# Patient Record
Sex: Female | Born: 1995 | Race: White | Hispanic: No | Marital: Single | Smoking: Current every day smoker
Health system: Southern US, Community
[De-identification: ages and names within clinical notes are randomized; demographics above are authoritative.]

## PROBLEM LIST (undated history)

## (undated) ENCOUNTER — Inpatient Hospital Stay (HOSPITAL_COMMUNITY): Payer: BLUE CROSS/BLUE SHIELD

## (undated) DIAGNOSIS — R51 Headache: Secondary | ICD-10-CM

## (undated) DIAGNOSIS — H4710 Unspecified papilledema: Secondary | ICD-10-CM

## (undated) DIAGNOSIS — R519 Headache, unspecified: Secondary | ICD-10-CM

## (undated) HISTORY — DX: Unspecified papilledema: H47.10

## (undated) HISTORY — DX: Headache, unspecified: R51.9

## (undated) HISTORY — PX: OTHER SURGICAL HISTORY: SHX169

## (undated) HISTORY — DX: Headache: R51

---

## 2015-08-28 ENCOUNTER — Encounter: Payer: Self-pay | Admitting: Neurology

## 2015-08-28 ENCOUNTER — Ambulatory Visit (INDEPENDENT_AMBULATORY_CARE_PROVIDER_SITE_OTHER): Payer: BLUE CROSS/BLUE SHIELD | Admitting: Neurology

## 2015-08-28 VITALS — BP 131/89 | HR 82 | Ht 70.0 in | Wt 247.0 lb

## 2015-08-28 DIAGNOSIS — G43909 Migraine, unspecified, not intractable, without status migrainosus: Secondary | ICD-10-CM | POA: Insufficient documentation

## 2015-08-28 DIAGNOSIS — G932 Benign intracranial hypertension: Secondary | ICD-10-CM | POA: Diagnosis not present

## 2015-08-28 DIAGNOSIS — G43009 Migraine without aura, not intractable, without status migrainosus: Secondary | ICD-10-CM

## 2015-08-28 MED ORDER — TOPIRAMATE 50 MG PO TABS: ORAL_TABLET | ORAL | Status: DC

## 2015-08-28 NOTE — Progress Notes (Signed)
PATIENT: Courtney Osborne DOB: Sep 21, 1995  Chief Complaint  Patient presents with  . Papilledema    She is here with her sister, Aundra Millet.  She had been having frequent headaches for several months that prompted a medical evaluation by her eye doctor.  Her exam revealed optic nerve swelling.  He sent her for a MRI scan that was abnormal.  Her headaches have improved and she denies having vision difficulty since getting new glasses.     HISTORICAL  Courtney Osborne is a 20 years old right-handed female, accompanied by her sister Aundra Millet seen in refer by her ophthalmologist  Izora Ribas in August 28 2015 for evaluation of headaches, optic nerve swelling  I have reviewed and summarized most recent office note in December twelfth 2016, there was mild bilateral margin blurry without vascular obscurations  She reported history of migraine headaches since high school, her typical migraine are bilateral retro-orbital area severe pounding headache with associated light noise sensitivity, nauseous, headache can last for a few hours, usually relieved by Excedrin Migraine, but sometimes she has multiple recurrent headaches in short period of time,  In December 2016, she works long hours, very stressful, she began to have frequent headaches, also noticed blurry vision, difficulty focusing,  She was seen by optometrist, 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 Cephalosporins Hives    HOME MEDICATIONS: Current Outpatient Prescriptions  Medication Sig Dispense Refill  . norgestimate-ethinyl estradiol (ORTHO-CYCLEN,SPRINTEC,PREVIFEM)  0.25-35 MG-MCG tablet Take 1 tablet by mouth daily.       PAST MEDICAL HISTORY: Past Medical History  Diagnosis Date  . Optic nerve edema   . Headache     PAST SURGICAL HISTORY: Past Surgical History  Procedure Laterality Date  . No past surgeries      FAMILY HISTORY: Family History  Problem Relation Age of Onset  . Hypertension Father   . Hypercholesterolemia Father   . Healthy Mother   . Diabetes Sister   . Hypertension Sister     SOCIAL HISTORY:  Social History   Social History  . Marital Status: Single    Spouse Name: N/A  . Number of Children: 0  . 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 Smoking status: Current Every Day Smoker -- 0.50 packs/day    Types: Cigarettes  . Smokeless tobacco: Not on file  . Alcohol Use: No  . Drug Use: No  . Sexual Activity: Not on file   Other Topics Concern  . Not on file   Social History Narrative   Lives at home with parents.   Right-handed.   1 cup caffeine daily.     PHYSICAL EXAM   Filed Vitals:   08/28/15 1031  BP: 131/89  Pulse: 82  Height: 5\' 10"  (1.778 m)  Weight: 247 lb (112.038 kg)    Not recorded      Body mass index is 35.44 kg/(m^2).  PHYSICAL EXAMNIATION:  Gen: NAD, conversant, well nourised, obese, well groomed  Cardiovascular: Regular rate rhythm, no peripheral edema, warm, nontender. Eyes: Conjunctivae clear without exudates or hemorrhage Neck: Supple, no carotid bruise. Pulmonary: Clear to auscultation bilaterally   NEUROLOGICAL EXAM:  MENTAL STATUS: Speech:    Speech is normal; fluent and spontaneous with normal comprehension.  Cognition:     Orientation to time, place and person     Normal recent and remote memory     Normal Attention span and concentration     Normal Language, naming, repeating,spontaneous speech     Fund of knowledge   CRANIAL NERVES: CN II: Visual  fields are full to confrontation. Fundoscopic exam showed Larry attached bilaterally, there was no vascular changes. Pupils are round equal and briskly reactive to light. CN III, IV, VI: extraocular movement are normal. No ptosis. CN V: Facial sensation is intact to pinprick in all 3 divisions bilaterally. Corneal responses are intact.  CN VII: Face is symmetric with normal eye closure and smile. CN VIII: Hearing is normal to rubbing fingers CN IX, X: Palate elevates symmetrically. Phonation is normal. CN XI: Head turning and shoulder shrug are intact CN XII: Tongue is midline with normal movements and no atrophy.  MOTOR: There is no pronator drift of out-stretched arms. Muscle bulk and tone are normal. Muscle strength is normal.  REFLEXES: Reflexes are 2+ and symmetric at the biceps, triceps, knees, and ankles. Plantar responses are flexor.  SENSORY: Intact to light touch, pinprick, position sense, and vibration sense are intact in fingers and toes.  COORDINATION: Rapid alternating movements and fine finger movements are intact. There is no dysmetria on finger-to-nose and heel-knee-shin.    GAIT/STANCE: Posture is normal. Gait is steady with normal steps, base, arm swing, and turning. Heel and toe walking are normal. Tandem gait is normal.  Romberg is absent.   DIAGNOSTIC DATA (LABS, IMAGING, TESTING) - I reviewed patient records, labs, notes, testing and imaging myself where available.   ASSESSMENT AND PLAN  Courtney Osborne is a 20 y.o. female   Pseudotumor cerebri  Fluoroscopy guided lumbar puncture  Add on Topamax titrating to 100 mg twice a day Chronic Migraine Headaches  Excedrin Migraine as needed   Levert FeinsteinYijun Argyle Gustafson, M.D. Ph.D.  [facility] [address], [address] [place], [place] [address] Ph: [phone](336) [phone] Fax: [phone]  ON:[id number]:Nathaniel Cleaveland, DO

## 2015-09-09 ENCOUNTER — Telehealth: Payer: Self-pay | Admitting: Neurology

## 2015-09-09 NOTE — Telephone Encounter (Signed)
Courtney Osborne with [facility] is calling and states the patient would like to have [place] lumbar puncture in [place] closer to where she lives so she did not schedule her at [facility].  Thanks!

## 2015-09-10 NOTE — Telephone Encounter (Signed)
[facility] and they relayed to me to fax orders to [facility] at [phone] and she will call patient in one weeks time to schedule.

## 2015-09-11 ENCOUNTER — Telehealth: Payer: Self-pay | Admitting: Neurology

## 2015-09-11 NOTE — Telephone Encounter (Signed)
Patient has now decided to have her LP in [place].  I have spoken to [place] at [facility] and she will contact the patient to schedule.

## 2015-09-11 NOTE — Telephone Encounter (Signed)
Spoke with Lurena Joiner - she is unable to schedule patient for at least two weeks.  I called the patient and she would now like to have the LP in [place].  Rayfield Citizen at [facility] is aware and will contact the patient for scheduling.  Lurena Joiner at [facility] is aware.

## 2015-09-11 NOTE — Telephone Encounter (Signed)
[facility] Rn with [facility] [phone] calling reg orders for LP. She needs clarification on lots of questions before she can schedule the pt

## 2015-09-11 NOTE — Telephone Encounter (Signed)
She is scheduled on 09/18/15.

## 2015-09-11 NOTE — Telephone Encounter (Signed)
[facility] [phone] called regarding lumbar puncture, needs to speak with Marcelino Duster regarding this.

## 2015-09-18 ENCOUNTER — Other Ambulatory Visit: Payer: Self-pay | Admitting: Neurology

## 2015-09-18 ENCOUNTER — Other Ambulatory Visit: Payer: Self-pay | Admitting: *Deleted

## 2015-09-18 ENCOUNTER — Ambulatory Visit
Admission: RE | Admit: 2015-09-18 | Discharge: 2015-09-18 | Disposition: A | Discharge status: Home / Self Care | Payer: BLUE CROSS/BLUE SHIELD | Source: Ambulatory Visit | Attending: Neurology | Admitting: Neurology

## 2015-09-18 ENCOUNTER — Telehealth: Payer: Self-pay | Admitting: Neurology

## 2015-09-18 DIAGNOSIS — G932 Benign intracranial hypertension: Secondary | ICD-10-CM

## 2015-09-18 DIAGNOSIS — G43009 Migraine without aura, not intractable, without status migrainosus: Secondary | ICD-10-CM

## 2015-09-18 LAB — CSF CELL COUNT WITH DIFFERENTIAL
Eosinophils, CSF: 0 % (ref 0–1)
LYMPHS CSF: 76 % (ref 40–80)
MONOCYTE/MACROPHAGE: 21 % (ref 15–45)
RBC COUNT CSF: 0 uL
SEGMENTED NEUTROPHILS-CSF: 3 % (ref 0–6)
Tube #: 3
WBC CSF: 1 uL (ref 0–5)

## 2015-09-18 LAB — GRAM STAIN: Gram Stain: NONE SEEN

## 2015-09-18 LAB — PROTEIN, CSF: Total Protein, CSF: 49 mg/dL — ABNORMAL HIGH (ref 15–45)

## 2015-09-18 LAB — GLUCOSE, CSF: Glucose, CSF: 68 mg/dL (ref 43–76)

## 2015-09-18 MED ORDER — TOPIRAMATE 100 MG PO TABS: ORAL_TABLET | ORAL | Status: DC

## 2015-09-18 NOTE — Discharge Instructions (Signed)

## 2015-09-18 NOTE — Telephone Encounter (Signed)
Patient aware of results.  Says she is doing well on  BID.  She would like a new [facility] [place], [place].

## 2015-09-18 NOTE — Telephone Encounter (Signed)
I have called, failed to reach her,   Please call her again, at spinal tap, open pressure was mildly elevated 22 cm water, normal being less than 20 cm water, spinal fluid testing was normal,  She should continue taking Topamax as previously prescribed

## 2015-09-19 LAB — VDRL, CSF: SYPHILIS VDRL QUANT CSF: NONREACTIVE

## 2015-09-30 ENCOUNTER — Other Ambulatory Visit: Payer: Self-pay | Admitting: *Deleted

## 2015-09-30 ENCOUNTER — Telehealth: Payer: Self-pay | Admitting: Neurology

## 2015-09-30 MED ORDER — TOPIRAMATE 100 MG PO TABS: ORAL_TABLET | ORAL | Status: DC

## 2015-09-30 NOTE — Telephone Encounter (Signed)
Rx sent to correct pharmacy.

## 2015-09-30 NOTE — Telephone Encounter (Signed)
Patient is calling and states that her Rx Topiramate 100 mg should have been sent to [facility] [place], [place] and is requesting a transfer from [place] where it was sent.  Thanks!

## 2015-10-01 NOTE — Telephone Encounter (Signed)
Pt's mother called and states rx topiramate (TOPAMAX) 100 MG tablet will cost $144 for her daughter. She can not afford that and is wondering if anything can be done. New medication, generic, new pharmacy? Please call and advise [phone] Bene, Mother

## 2015-10-01 NOTE — Telephone Encounter (Signed)
Called the pharmacy - goodrx coupon code provided - cost of med dropped to $13.45 - pt and mother aware of new cost and will pick up the prescription.

## 2015-10-14 LAB — FUNGUS CULTURE W SMEAR: SMEAR RESULT: NONE SEEN

## 2015-10-16 ENCOUNTER — Ambulatory Visit (INDEPENDENT_AMBULATORY_CARE_PROVIDER_SITE_OTHER): Payer: BLUE CROSS/BLUE SHIELD | Admitting: Neurology

## 2015-10-16 ENCOUNTER — Encounter: Payer: Self-pay | Admitting: Neurology

## 2015-10-16 VITALS — BP 126/84 | HR 81 | Ht 70.0 in | Wt 245.0 lb

## 2015-10-16 DIAGNOSIS — G43009 Migraine without aura, not intractable, without status migrainosus: Secondary | ICD-10-CM

## 2015-10-16 DIAGNOSIS — G932 Benign intracranial hypertension: Secondary | ICD-10-CM

## 2015-10-16 MED ORDER — TOPIRAMATE 100 MG PO TABS: ORAL_TABLET | ORAL | Status: AC

## 2015-10-16 NOTE — Progress Notes (Signed)
Chief Complaint  Patient presents with  . Pseudotumor Cerebri    She is here with her sister, Aundra Millet.  She is tolerating the topiramate , BID well.  States her headaches have improved.      PATIENT: Courtney Osborne DOB: 08/19/1996  Chief Complaint  Patient presents with  . Pseudotumor Cerebri    She is here with her sister, Aundra Millet.  She is tolerating the topiramate , BID well.  States her headaches have improved.  She has a pending LP appointment on 10/19/15.     HISTORICAL  Courtney Osborne is a 20 years old right-handed female, accompanied by her sister Aundra Millet seen in refer by her ophthalmologist  Izora Ribas in August 28 2015 for evaluation of headaches, optic nerve swelling  I have reviewed and summarized most recent office note in December twelfth 2016, there was mild bilateral margin blurry without vascular obscurations  She reported history of migraine headaches since high school, her typical migraine are bilateral retro-orbital area severe pounding headache with associated light noise sensitivity, nauseous, headache can last for a few hours, usually relieved by Excedrin Migraine, but sometimes she has multiple recurrent headaches in short period of time,  In December 2016, she works long hours, very stressful, she began to have frequent headaches, also noticed blurry vision, difficulty focusing,  She was seen by optometrist, 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  She did experience some change of the case, She tends to drink 102 soft drinks each day,  She has no blurry vision.   REVIEW OF SYSTEMS: Full 14 system review of systems performed and notable only for blurred vision, headaches  ALLERGIES: Allergies  Allergen Reactions  . Cephalosporins Hives    HOME MEDICATIONS: Current Outpatient Prescriptions  Medication Sig Dispense Refill  . norgestimate-ethinyl estradiol (ORTHO-CYCLEN,SPRINTEC,PREVIFEM) 0.25-35 MG-MCG tablet Take 1 tablet by mouth daily.       PAST MEDICAL HISTORY: Past Medical History  Diagnosis Date  . Optic nerve edema   . Headache     PAST SURGICAL HISTORY: Past Surgical History  Procedure Laterality Date  . No past surgeries      FAMILY HISTORY: Family History  Problem Relation Age of Onset  . Hypertension Father   . Hypercholesterolemia Father   . Healthy Mother   . Diabetes Sister   . Hypertension Sister     SOCIAL HISTORY:  Social History   Social History  . Marital Status: Single    Spouse Name: N/A  . Number of Children: 0  . 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 Smoking status: Current Every Day Smoker -- 0.50 packs/day    Types: Cigarettes  . Smokeless tobacco: Not on file  . Alcohol Use: No  . Drug Use: No  . Sexual Activity: Not on file   Other Topics Concern  . Not on file  Social History Narrative   Lives at home with parents.   Right-handed.   1 cup caffeine daily.     PHYSICAL EXAM   Filed Vitals:   10/16/15 0929  BP: 126/84  Pulse: 81  Height:  (1.778 m)  Weight: 245 lb (111.131 kg)    Not recorded      Body mass index is 35.15 kg/(m^2).  PHYSICAL EXAMNIATION:  Gen: NAD, conversant, well nourised, obese, well groomed                     Cardiovascular: Regular rate rhythm, no peripheral edema, warm, nontender. Eyes: Conjunctivae  clear without exudates or hemorrhage Neck: Supple, no carotid bruise. Pulmonary: Clear to auscultation bilaterally   NEUROLOGICAL EXAM:  MENTAL STATUS: Speech:    Speech is normal; fluent and spontaneous with normal comprehension.  Cognition:     Orientation to time, place and person     Normal recent and remote memory     Normal Attention span and concentration     Normal Language, naming, repeating,spontaneous speech     Fund of knowledge   CRANIAL NERVES: CN II: Visual fields are full to confrontation. Fundoscopic exam showed mild blurred edge bilaterally, there was no vascular changes. Pupils are round equal and briskly reactive to light. CN III, IV, VI: extraocular movement are normal. No ptosis. CN V: Facial sensation is intact to pinprick in all 3 divisions bilaterally. Corneal responses are intact.  CN VII: Face is symmetric with normal eye closure and smile. CN VIII: Hearing is normal to rubbing fingers CN IX, X: Palate elevates symmetrically. Phonation is normal. CN XI: Head turning and shoulder shrug are intact CN XII: Tongue is midline with normal movements and no atrophy.  MOTOR: There is no pronator drift of out-stretched arms. Muscle bulk and tone are normal. Muscle strength is normal.  REFLEXES: Reflexes are 2+ and symmetric at the biceps, triceps, knees, and ankles. Plantar responses are flexor.  SENSORY: Intact to light touch, pinprick, position sense, and vibration sense are intact in fingers and toes.  COORDINATION: Rapid alternating movements and fine finger movements are intact. There is no dysmetria on finger-to-nose and heel-knee-shin.    GAIT/STANCE: Posture is normal. Gait is steady with normal steps, base, arm swing, and turning. Heel and toe walking are normal. Tandem gait is normal.  Romberg is absent.   DIAGNOSTIC DATA (LABS, IMAGING, TESTING) - I reviewed patient records, labs, notes, testing and imaging myself where  available.   ASSESSMENT AND PLAN  Courtney Osborne is a 20 y.o. female   Pseudotumor cerebri  Fluoroscopy guided lumbar puncture in September 18 2015, open pressure 22 cm water, CSF showed mild elevated total protein 49 otherwise normal  Keep Topamax 100 mg twice a day  Chronic Migraine Headaches  Excedrin Migraine as needed   Levert Feinstein, M.D. Ph.D.  [facility] [address] [place], [place] [address] Ph: [phone] Fax: [phone]  QI:[id number] Cleaveland, DO

## 2015-12-17 ENCOUNTER — Ambulatory Visit (INDEPENDENT_AMBULATORY_CARE_PROVIDER_SITE_OTHER): Payer: BLUE CROSS/BLUE SHIELD | Admitting: Nurse Practitioner

## 2015-12-17 ENCOUNTER — Encounter: Payer: Self-pay | Admitting: Nurse Practitioner

## 2015-12-17 VITALS — BP 128/78 | HR 75 | Ht 70.0 in | Wt 246.8 lb

## 2015-12-17 DIAGNOSIS — G43009 Migraine without aura, not intractable, without status migrainosus: Secondary | ICD-10-CM | POA: Diagnosis not present

## 2015-12-17 DIAGNOSIS — G932 Benign intracranial hypertension: Secondary | ICD-10-CM

## 2015-12-17 NOTE — Progress Notes (Signed)
[facility]  PATIENT: Courtney Osborne DOB: 03/23/1996   REASON FOR VISIT: Follow-up for migraines and pseudotumor cerebri HISTORY FROM: Patient    HISTORY OF PRESENT ILLNESS: HISTORY Courtney Osborne is a 20 years old right-handed female, accompanied by her sister Courtney Osborne seen in refer by her ophthalmologist Courtney Osborne in August 28 2015 for evaluation of headaches, optic nerve swelling I have reviewed and summarized most recent office note in December twelfth 2016, there was mild bilateral margin blurry without vascular obscurations She reported history of migraine headaches since high school, her typical migraine are bilateral retro-orbital area severe pounding headache with associated light noise sensitivity, nauseous, headache can last for a few hours, usually relieved by Excedrin Migraine, but sometimes she has multiple recurrent headaches in short period of time, In December 2016, she works long hours, very stressful, she began to have frequent headaches, also noticed blurry vision, difficulty focusing, She was seen by optometrist, 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 She did experience some change of the case, She tends to drink  soft drinks each day,She has no blurry vision.  UPDATE 04/25/2017CM Courtney Osborne, 20 year old female returns for follow-up. She has a history of migraines and pseudotumor cerebri. Her headaches are  under good control with Topamax 100 twice daily. She did not have her repeat LP in February after her last visit with Dr.Yan. She denies any visual symptoms today she is not aware of any foods that cause problems. She returns for reevaluation   REVIEW OF SYSTEMS: Full 14 system review of systems performed and notable only for those listed, all others are neg:  Constitutional: neg  Cardiovascular: neg Ear/Nose/Throat: neg  Skin: neg Eyes: neg Respiratory: neg Gastroitestinal: neg  Hematology/Lymphatic: neg  Endocrine: neg Musculoskeletal:neg Allergy/Immunology: neg Neurological: neg Psychiatric: neg Sleep : neg   ALLERGIES: Allergies  Allergen Reactions  . Cephalosporins Hives    HOME MEDICATIONS: Outpatient Prescriptions Prior to Visit  Medication Sig Dispense Refill  . norgestimate-ethinyl estradiol (ORTHO-CYCLEN,SPRINTEC,PREVIFEM) 0.25-35 MG-MCG tablet Take 1 tablet by mouth daily.    Marland Kitchen topiramate (TOPAMAX) 100 MG tablet Take one tablet twice daily. 60 tablet 11   No facility-administered medications prior to visit.    PAST MEDICAL HISTORY: Past Medical History  Diagnosis Date  . Optic nerve edema   . Headache     PAST SURGICAL HISTORY: Past Surgical History  Procedure Laterality Date  . No past surgeries      FAMILY HISTORY: Family History  Problem Relation Age of Onset  . Hypertension Father   . Hypercholesterolemia Father   . Healthy Mother   . Diabetes Sister   . Hypertension Sister     SOCIAL HISTORY: Social History   Social History  . Marital Status: Single    Spouse Name: N/A  . Number of Children: 0  . 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 Smoking status: Current Every Day Smoker -- 0.50 packs/day    Types: Cigarettes  . Smokeless tobacco: Not on file  . Alcohol Use: No  . Drug Use: No  . Sexual Activity: Not on file   Other Topics Concern  . Not on  file   Social History Narrative   Lives at home with parents.   Right-handed.   1 cup caffeine daily.     PHYSICAL EXAM  Filed Vitals:   12/17/15 0917  BP: 128/78  Pulse: 75  Height: 5\' 10"  (1.778 m)  Weight: 246 lb 12.8 oz (111.948 kg)  SpO2: 97%   Body mass index is 35.41 kg/(m^2).  Generalized: Well developed, Obese female in no acute distress  Head: normocephalic and atraumatic,. Oropharynx benign  Neck: Supple, no carotid bruits  Cardiac: Regular rate rhythm, no murmur  Musculoskeletal: No deformity   Neurological examination   Mentation: Alert oriented to time, place, history taking. Attention span and concentration appropriate. Recent and remote memory intact.  Follows all commands speech and language fluent.   Cranial nerve II-XII: Visual acuity 20/20 bilaterally. Fundoscopic exam shows mild blurring at edges bilaterally.Pupils were equal round reactive to light extraocular movements were full, visual field were full on confrontational test. Facial sensation and strength were normal. hearing was intact to finger rubbing bilaterally. Uvula tongue midline. head turning and shoulder shrug were normal and symmetric.Tongue protrusion into cheek strength was normal. Motor: normal bulk and tone, full strength in the BUE, BLE, fine finger movements normal, no pronator drift. No focal weakness Sensory: normal and symmetric to light touch, in the upper and lower extremities Coordination: finger-nose-finger, heel-to-shin bilaterally, no dysmetria Reflexes: Brachioradialis 2/2, biceps 2/2, triceps 2/2, patellar 2/2, Achilles 2/2, plantar responses were flexor bilaterally. Gait and Station: Rising up from seated position without assistance, normal stance,  moderate stride, good arm swing, smooth turning, able to perform tiptoe, and heel walking without difficulty. Tandem gait is steady  DIAGNOSTIC DATA (LABS, IMAGING, TESTING)  ASSESSMENT AND PLAN  20 y.o. year old female  has a  past medical history of Optic nerve edema and Headache. here to follow-up. Headaches are currently in good control with Topamax. She did not have repeat LP in February and has no plans to do so.  Continue Topamax at 100mg  twice daily Call for increase in headaches I spent additional 10 minutes in total face to face time with the patient more than 50% of which was spent counseling and coordination of care, reviewing test results reviewing medications and discussing and reviewing the diagnosis of migraine and further treatment options. Importance of keeping a diary if headaches worsen to include the time of the headache what you're doing any other specific information that would be useful. Discussed stress relief techniques such as deep breathing muscle relaxation mental relaxation to music. Discussed importance of exercise, regular meals  and sleep. Sleep deprivation can be a migraine trigger F/U in 6 monthsVst time 25 min Nilda RiggsNancy Carolyn Artesia Berkey, [facility] HospitalGNP, [facility] CenterBC, APRN  [facility] [address], [address] [place], [place] [address] [phone]

## 2015-12-17 NOTE — Patient Instructions (Signed)
Continue Topamax at 100mg  twice daily Call for increase in headaches F/U in 6 months

## 2016-03-28 IMAGING — XA DG FLUORO GUIDE LUMBAR PUNCTURE
1 series · 1 of 1 positions shown · non-contrast
Comparison: none

CLINICAL DATA: Papilledema, headache

EXAM:
LUMBAR PUNCTURE UNDER FLUOROSCOPY
FLUOROSCOPY TIME:  17 seconds, 160 uZymK DAP
TECHNIQUE: The procedure, risks (including but not limited to bleeding,
infection, organ damage ), benefits, and alternatives were explained
to the patient. Questions regarding the procedure were encouraged
and answered. The patient understands and consents to the procedure.
An appropriate skin entry site was determined fluoroscopically.
Operator donned sterile gloves and mask. Patient placed in left
lateral decubitus positioning. Skin site was marked, then prepped
with Betadine, draped in usual sterile fashion, and infiltrated
locally with 1% lidocaine. A 20 gauge spinal needle advanced into
the thecal sac at L2-3 from a left interlaminar approach. Clear
colorless CSF spontaneously returned, with opening pressure of 22 cm
water. 9ml CSF were collected and divided among 4 sterile vials for
the requested laboratory studies. The needle was then removed.
COMPLICATIONS:
None immediate

[Series 1: ortho standard · 1 of 1 slices shown]
[im 1/1]
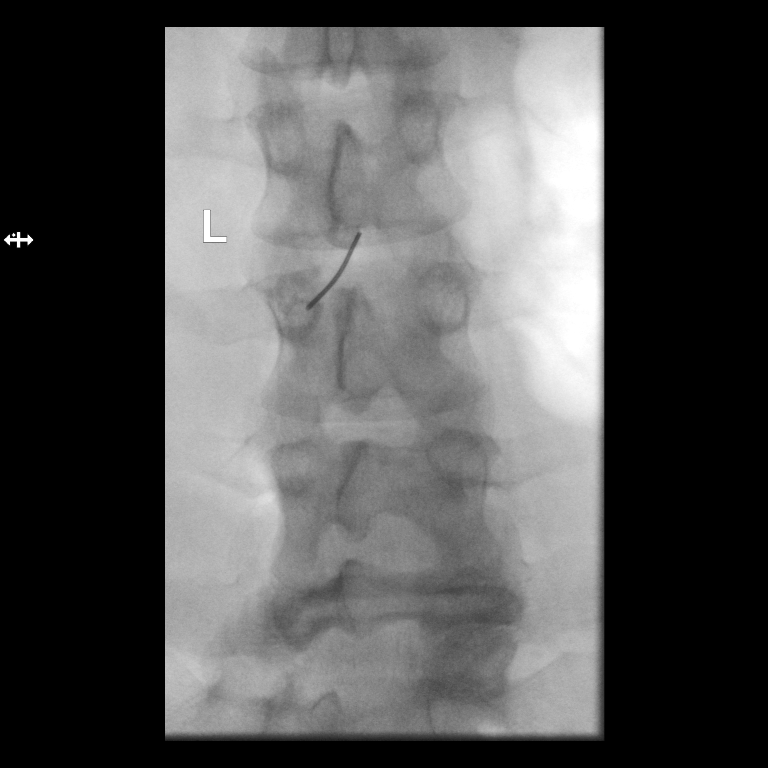

[1 of 1 positions shown; findings below may reference images not displayed]

IMPRESSION: 1. Technically successful lumbar puncture under fluoroscopy.

## 2016-06-17 ENCOUNTER — Ambulatory Visit: Payer: BLUE CROSS/BLUE SHIELD | Admitting: Nurse Practitioner
